# Patient Record
Sex: Female | Born: 2000 | Race: Black or African American | Hispanic: No | Marital: Single | Smoking: Never smoker
Health system: Southern US, Community
[De-identification: ages and names within clinical notes are randomized; demographics above are authoritative.]

## PROBLEM LIST (undated history)

## (undated) HISTORY — PX: OTHER SURGICAL HISTORY: SHX169

---
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
## 2022-10-10 ENCOUNTER — Emergency Department (HOSPITAL_BASED_OUTPATIENT_CLINIC_OR_DEPARTMENT_OTHER)
Admission: EM | Admit: 2022-10-10 | Discharge: 2022-10-10 | Disposition: A | Discharge status: Home / Self Care | Payer: Commercial Managed Care - HMO | Attending: Emergency Medicine | Admitting: Emergency Medicine

## 2022-10-10 ENCOUNTER — Other Ambulatory Visit: Payer: Self-pay

## 2022-10-10 ENCOUNTER — Encounter (HOSPITAL_BASED_OUTPATIENT_CLINIC_OR_DEPARTMENT_OTHER): Payer: Self-pay | Admitting: Emergency Medicine

## 2022-10-10 DIAGNOSIS — Y9389 Activity, other specified: Secondary | ICD-10-CM | POA: Diagnosis not present

## 2022-10-10 DIAGNOSIS — X04XXXA Exposure to ignition of highly flammable material, initial encounter: Secondary | ICD-10-CM | POA: Insufficient documentation

## 2022-10-10 DIAGNOSIS — T2121XA Burn of second degree of chest wall, initial encounter: Secondary | ICD-10-CM | POA: Diagnosis not present

## 2022-10-10 DIAGNOSIS — T300 Burn of unspecified body region, unspecified degree: Secondary | ICD-10-CM

## 2022-10-10 DIAGNOSIS — T2122XA Burn of second degree of abdominal wall, initial encounter: Secondary | ICD-10-CM | POA: Insufficient documentation

## 2022-10-10 DIAGNOSIS — S3991XA Unspecified injury of abdomen, initial encounter: Secondary | ICD-10-CM | POA: Diagnosis present

## 2022-10-10 DIAGNOSIS — T24212A Burn of second degree of left thigh, initial encounter: Secondary | ICD-10-CM | POA: Insufficient documentation

## 2022-10-10 MED ORDER — OXYCODONE HCL 5 MG PO TABS: 5.0000 mg | ORAL_TABLET | Freq: Four times a day (QID) | ORAL | 0 refills | Status: AC | PRN

## 2022-10-10 MED ORDER — ACETAMINOPHEN 325 MG PO TABS
650.0000 mg | ORAL_TABLET | Freq: Once | ORAL | Status: AC
  Administered 2022-10-10: 650 mg via ORAL
  Filled 2022-10-10: qty 2

## 2022-10-10 MED ORDER — SILVER SULFADIAZINE 1 % EX CREA
TOPICAL_CREAM | Freq: Once | CUTANEOUS | Status: AC
  Filled 2022-10-10: qty 85

## 2022-10-10 MED ORDER — HYDROMORPHONE HCL 1 MG/ML IJ SOLN
1.0000 mg | Freq: Once | INTRAMUSCULAR | Status: AC
  Administered 2022-10-10: 1 mg via INTRAVENOUS
  Filled 2022-10-10: qty 1

## 2022-10-10 MED ORDER — SILVER SULFADIAZINE 1 % EX CREA: 1.0000 | TOPICAL_CREAM | Freq: Every day | CUTANEOUS | 0 refills | Status: AC

## 2022-10-10 NOTE — ED Notes (Signed)
Provider notified patient is in pain.

## 2022-10-10 NOTE — ED Notes (Signed)
Dressing applied. 
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
## 2022-10-10 NOTE — ED Notes (Signed)
ED Provider at bedside. 
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
